# Patient Record
Sex: Female | Born: 1970 | Race: Black or African American | Hispanic: No | Smoking: Never smoker
Health system: Southern US, Community
[De-identification: ages and names within clinical notes are randomized; demographics above are authoritative.]

## PROBLEM LIST (undated history)

## (undated) DIAGNOSIS — E119 Type 2 diabetes mellitus without complications: Secondary | ICD-10-CM

---

## 2017-04-11 ENCOUNTER — Emergency Department (HOSPITAL_COMMUNITY)
Admission: EM | Admit: 2017-04-11 | Discharge: 2017-04-12 | Disposition: A | Discharge status: Home / Self Care | Payer: Managed Care, Other (non HMO) | Attending: Emergency Medicine | Admitting: Emergency Medicine

## 2017-04-11 ENCOUNTER — Encounter (HOSPITAL_COMMUNITY): Payer: Self-pay

## 2017-04-11 ENCOUNTER — Emergency Department (HOSPITAL_COMMUNITY): Payer: Managed Care, Other (non HMO)

## 2017-04-11 DIAGNOSIS — E119 Type 2 diabetes mellitus without complications: Secondary | ICD-10-CM | POA: Insufficient documentation

## 2017-04-11 DIAGNOSIS — R112 Nausea with vomiting, unspecified: Secondary | ICD-10-CM | POA: Insufficient documentation

## 2017-04-11 DIAGNOSIS — R1084 Generalized abdominal pain: Secondary | ICD-10-CM | POA: Diagnosis not present

## 2017-04-11 DIAGNOSIS — R109 Unspecified abdominal pain: Secondary | ICD-10-CM | POA: Diagnosis present

## 2017-04-11 HISTORY — DX: Type 2 diabetes mellitus without complications: E11.9

## 2017-04-11 LAB — CBC
HCT: 40.3 % (ref 36.0–46.0)
Hemoglobin: 12.8 g/dL (ref 12.0–15.0)
MCH: 27.1 pg (ref 26.0–34.0)
MCHC: 31.8 g/dL (ref 30.0–36.0)
MCV: 85.4 fL (ref 78.0–100.0)
PLATELETS: 447 10*3/uL — AB (ref 150–400)
RBC: 4.72 MIL/uL (ref 3.87–5.11)
RDW: 13.7 % (ref 11.5–15.5)
WBC: 5.4 10*3/uL (ref 4.0–10.5)

## 2017-04-11 LAB — BASIC METABOLIC PANEL
ANION GAP: 9 (ref 5–15)
CALCIUM: 9.8 mg/dL (ref 8.9–10.3)
CO2: 28 mmol/L (ref 22–32)
CREATININE: 0.59 mg/dL (ref 0.44–1.00)
Chloride: 102 mmol/L (ref 101–111)
GFR calc Af Amer: >60 mL/min (ref >60)
GLUCOSE: 177 mg/dL — AB (ref 65–99)
Potassium: 3.3 mmol/L — ABNORMAL LOW (ref 3.5–5.1)
Sodium: 139 mmol/L (ref 135–145)

## 2017-04-11 LAB — TROPONIN I

## 2017-04-11 LAB — LIPASE, BLOOD: LIPASE: 37 U/L (ref 11–51)

## 2017-04-11 NOTE — ED Triage Notes (Signed)
Pt endorses left sided chest pain and generalized abd pain x 1 month with n/v every other day. VSS.

## 2017-04-11 NOTE — ED Notes (Signed)
Pt family at nurses station to ask for update, apologized for delay.

## 2017-04-12 LAB — URINALYSIS, ROUTINE W REFLEX MICROSCOPIC
BILIRUBIN URINE: NEGATIVE
GLUCOSE, UA: 50 mg/dL — AB
HGB URINE DIPSTICK: NEGATIVE
KETONES UR: 5 mg/dL — AB
Leukocytes, UA: NEGATIVE
Nitrite: NEGATIVE
PH: 7 (ref 5.0–8.0)
Protein, ur: NEGATIVE mg/dL
SPECIFIC GRAVITY, URINE: 1.013 (ref 1.005–1.030)

## 2017-04-12 LAB — POC OCCULT BLOOD, ED: Fecal Occult Bld: NEGATIVE

## 2017-04-12 LAB — I-STAT CG4 LACTIC ACID, ED: LACTIC ACID, VENOUS: 0.99 mmol/L (ref 0.5–1.9)

## 2017-04-12 MED ORDER — ONDANSETRON HCL 4 MG/2ML IJ SOLN
4.0000 mg | Freq: Once | INTRAMUSCULAR | Status: AC
  Administered 2017-04-12: 4 mg via INTRAVENOUS
  Filled 2017-04-12: qty 2

## 2017-04-12 MED ORDER — PROMETHAZINE HCL 12.5 MG PO TABS: 12.5000 mg | ORAL_TABLET | Freq: Four times a day (QID) | ORAL | 0 refills | Status: AC | PRN

## 2017-04-12 MED ORDER — OXYCODONE-ACETAMINOPHEN 5-325 MG PO TABS
1.0000 | ORAL_TABLET | Freq: Once | ORAL | Status: AC
  Administered 2017-04-12: 1 via ORAL
  Filled 2017-04-12: qty 1

## 2017-04-12 MED ORDER — SODIUM CHLORIDE 0.9 % IV BOLUS (SEPSIS)
500.0000 mL | Freq: Once | INTRAVENOUS | Status: AC
  Administered 2017-04-12: 500 mL via INTRAVENOUS

## 2017-04-12 MED ORDER — MORPHINE SULFATE (PF) 4 MG/ML IV SOLN
4.0000 mg | Freq: Once | INTRAVENOUS | Status: AC
  Administered 2017-04-12: 4 mg via INTRAVENOUS
  Filled 2017-04-12: qty 1

## 2017-04-12 MED ORDER — SODIUM CHLORIDE 0.9 % IV BOLUS (SEPSIS)
1000.0000 mL | Freq: Once | INTRAVENOUS | Status: AC
  Administered 2017-04-12: 1000 mL via INTRAVENOUS

## 2017-04-12 MED ORDER — HYDROCODONE-ACETAMINOPHEN 5-325 MG PO TABS: 1.0000 | ORAL_TABLET | Freq: Four times a day (QID) | ORAL | 0 refills | Status: AC | PRN

## 2017-04-12 NOTE — ED Notes (Signed)
Hemoccult not crossing over. Results are negative. Asher MuirJamie PA aware.

## 2017-04-12 NOTE — ED Notes (Signed)
Pt up to bathroom without difficulty.

## 2017-04-12 NOTE — ED Provider Notes (Signed)
[facility]PT Provider Note   CSN: [id number] Arrival date & time: 04/11/17  2145     History   Chief Complaint Chief Complaint  Patient presents with  . Chest Pain  . Abdominal Pain    HPI Brooke Bray is a 46 y.o. female.  The history is provided by the patient and medical records. No language interpreter was used.  Chest Pain   Associated symptoms include abdominal pain, nausea and vomiting.  Abdominal Pain   Associated symptoms include nausea and vomiting. Pertinent negatives include diarrhea and constipation.   Brooke Bray is a 46 y.o. female  with a PMH of DM who presents to the Emergency Department complaining of intermittent abdominal pain, nausea and vomiting for the last 6 weeks. Pain acutely worsened last night. Pain is described as sharp, stabbing. Lasts about 5 minutes and then resolves. Returns after about 5-10 minutes. Patient states that she was seen at [facility] several times over the last month. 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 Per chart review, IR completed a mesenteric angiogram which did not show any vascular disease. Patient states that she felt better for about 3-4 days after discharge from the hospital, but then n/v and abdominal pain returned. Feels similar to prior when she went to ER at [facility] last month.    Past Medical History:  Diagnosis Date  . Diabetes mellitus without complication (HCC)     There are no active problems to display for this patient.   History reviewed. No pertinent surgical history.  OB History    No data available       Home Medications    Prior to Admission medications   Medication Sig Start Date End Date Taking? Authorizing Provider  HYDROcodone-acetaminophen (NORCO) 5-325 MG tablet Take 1 tablet by mouth every 6 (six) hours as needed for moderate pain. 04/12/17   Ward, Chase Picket, PA-C  promethazine (PHENERGAN) 12.5 MG tablet Take 1 tablet (12.5 mg total) by  mouth every 6 (six) hours as needed for nausea or vomiting. 04/12/17   Ward, Chase Picket, PA-C    Family History History reviewed. No pertinent family history.  Social History Social History  Substance Use Topics  . Smoking status: Never Smoker  . Smokeless tobacco: Not on file  . Alcohol use No     Allergies   Patient has no known allergies.   Review of Systems Review of Systems  Cardiovascular: Positive for chest pain.  Gastrointestinal: Positive for abdominal pain, nausea and vomiting. Negative for blood in stool, constipation and diarrhea.  All other systems reviewed and are negative.    Physical Exam Updated Vital Signs BP 125/82   Pulse 86   Temp 97.9 F (36.6 C) (Oral)   Resp 15   Ht 5\' 4"  (1.626 m)   Wt 65.8 kg (145 lb)   SpO2 100%   BMI 24.89 kg/m   Physical Exam  Constitutional: She is oriented to person, place, and time. She appears well-developed and well-nourished. No distress.  Non-toxic appearing.  HENT:  Head: Normocephalic and atraumatic.  Cardiovascular: Normal rate, regular rhythm and normal heart sounds.   No murmur heard. Pulmonary/Chest: Effort normal and breath sounds normal. No respiratory distress.  Abdominal: Soft. Bowel sounds are normal. She exhibits no distension.  Diffuse generalized tenderness without rebound or guarding. No CVA tenderness.  Musculoskeletal: She exhibits no edema.  Neurological: She is alert and oriented to person, place, and time.  Skin: Skin is  warm and dry.  Nursing note and vitals reviewed.    ED Treatments / Results  Labs (all labs ordered are listed, but only abnormal results are displayed) Labs Reviewed  BASIC METABOLIC PANEL - Abnormal; Notable for the following:       Result Value   Potassium 3.3 (*)    Glucose, Bld 177 (*)    BUN <5 (*)    All other components within normal limits  CBC - Abnormal; Notable for the following:    Platelets 447 (*)    All other components within normal limits    URINALYSIS, ROUTINE W REFLEX MICROSCOPIC - Abnormal; Notable for the following:    Glucose, UA 50 (*)    Ketones, ur 5 (*)    All other components within normal limits  TROPONIN I  LIPASE, BLOOD  I-STAT CG4 LACTIC ACID, ED  POC OCCULT BLOOD, ED    EKG  EKG Interpretation  Date/Time:  Monday April 11 2017 21:53:29 EDT Ventricular Rate:  108 PR Interval:  136 QRS Duration: 60 QT Interval:  344 QTC Calculation: 460 R Axis:   22 Text Interpretation:  Sinus tachycardia Cannot rule out Anterior infarct , age undetermined Abnormal ECG No acute changes No significant change since last tracing Confirmed by Derwood Kaplan [phone]) on 04/12/2017 1:42:21 AM       Radiology Dg Chest 2 View  Result Date: 04/11/2017 CLINICAL DATA:  Acute onset of left upper chest pain. Initial encounter. EXAM: CHEST  2 VIEW COMPARISON:  None. FINDINGS: The lungs are well-aerated and clear. There is no evidence of focal opacification, pleural effusion or pneumothorax. The heart is normal in size; the mediastinal contour is within normal limits. No acute osseous abnormalities are seen. IMPRESSION: No acute cardiopulmonary process seen. Electronically Signed   By: Roanna Raider M.D.   On: 04/11/2017 22:22    Procedures Procedures (including critical care time)  Medications Ordered in ED Medications  sodium chloride 0.9 % bolus 1,000 mL (0 mLs Intravenous Stopped 04/12/17 0321)  ondansetron (ZOFRAN) injection 4 mg (4 mg Intravenous Given 04/12/17 0143)  morphine 4 MG/ML injection 4 mg (4 mg Intravenous Given 04/12/17 0153)  sodium chloride 0.9 % bolus 500 mL (0 mLs Intravenous Stopped 04/12/17 0524)  oxyCODONE-acetaminophen (PERCOCET/ROXICET) 5-325 MG per tablet 1 tablet (1 tablet Oral Given 04/12/17 0427)     Initial Impression / Assessment and Plan / ED Course  I have reviewed the triage vital signs and the nursing notes.  Pertinent labs & imaging results that were available during my care of the patient  were reviewed by me and considered in my medical decision making (see chart for details).    Brooke Bray is a 46 y.o. female who presents to ED for intermittent n/v and abdominal pain for the last 6 weeks. She has been seen in ED at [facility] on 5/04, 5/20 and 5/21. She was then seen at [facility] on 5/22 where she was admitted. Chart extensively reviewed from these encounters.   Per chart review from discharge summary during this admission: "Her workup prior to admission included negative RUQ [place] and negative CT A/P w contrast. Given severity of symptoms, CT A/P w contrast was repeated on admission on 5/23 and again did not reveal intra-abdominal pathology. GI was consulted and performed EGD on 5/24 that was largely unremarkable. Pt also reported constipation for several days prior to admission and multiple enemas were performed. After receiving SMOG enema, she developed some BRBPR. Hb remained stable but she developed  a leukocytosis. She was cultured and started on IV cipro/flagyl. MRCP 5/26 unexpectedly showed possible colitis, most c/w infectious or inflammatory cause. Lactate remained wnl. C diff was neg. Blood and stool ultures showed now growth. It was felt that colitis was not present on admission, given normal WBC, afebrile and negative CT x2 in the setting of symptoms for weeks, and that this colitis may have been a sequelae of enemas or represent a hypotensive episode in setting of her poor po intake and keosis. GI closely followed along and performed colonoscopy on 5/29 the revealed watershed pattern of ischemic colitis. IR completed mesenteric angiogram that did not show any vascular disease, biopsies confirmed ischemia. General surgery was consulted and agreed with team's management. She was supported with IVF, IV anti-emetics, IV dilaudid. Her abdominal pain had improved enough that by the day of discharge she did not require any narcotics."  Labs today reviewed and reassuring. Hemoccult did not cross  over in the computer, but was negative. Repeat abdominal exam improved.  Feels better after pain and nausea meds along with IV hydration. She is now tolerating PO and feels improved. Evaluation does not show pathology that would require ongoing emergent intervention or inpatient treatment. She [facility]. She would like to see if one of our GI docs could see her earlier. Referral for GI given. Spoke at length with patient and family at the bedside about reasons to return to ER. All questions answered.   Patient discussed with Dr. Rhunette CroftNanavati who agrees with treatment plan.    Final Clinical Impressions(s) / ED Diagnoses   Final diagnoses:  Generalized abdominal pain  Non-intractable vomiting with nausea, unspecified vomiting type    New Prescriptions Discharge Medication List as of 04/12/2017  5:20 AM    START taking these medications   Details  HYDROcodone-acetaminophen (NORCO) 5-325 MG tablet Take 1 tablet by mouth every 6 (six) hours as needed for moderate pain., Starting Tue 04/12/2017, Print          Ward, Chase PicketJaime Pilcher, PA-C 04/12/17 [id number]    Derwood KaplanNanavati, Ankit, MD 04/12/17 2318

## 2017-04-12 NOTE — Discharge Instructions (Signed)
It was my pleasure taking care of you today! I hope you feel better soon.   Please call the GI clinic listed today to schedule a follow up appointment.   Increase hydration.   Return to ER for new or worsening symptoms, any additional concerns.   Pain medication only as needed for severe pain. Do not drink alcohol, drive or participate in any other potentially dangerous activities while taking opiate pain medication as it may make you sleepy. Do not take this medication with any other sedating medications, either prescription or over-the-counter. Do not take this with acetaminophen (Tylenol) as it is already contained within these medications.   This medication is an opiate (or narcotic) pain medication and can be habit forming.  Use it as little as possible to achieve adequate pain control.  Do not use or use it with extreme caution if you have a history of opiate abuse or dependence. This medication is intended for your use only - do not give any to anyone else and keep it in a secure place where nobody else, especially children, have access to it. It will also cause or worsen constipation, so you may want to consider taking an over-the-counter stool softener while you are taking this medication.

## 2017-04-22 ENCOUNTER — Encounter (HOSPITAL_COMMUNITY): Payer: Self-pay

## 2017-04-22 ENCOUNTER — Emergency Department (HOSPITAL_COMMUNITY): Payer: Managed Care, Other (non HMO)

## 2017-04-22 ENCOUNTER — Emergency Department (HOSPITAL_COMMUNITY)
Admission: EM | Admit: 2017-04-22 | Discharge: 2017-04-22 | Disposition: A | Discharge status: Home / Self Care | Payer: Managed Care, Other (non HMO) | Attending: Emergency Medicine | Admitting: Emergency Medicine

## 2017-04-22 DIAGNOSIS — R1084 Generalized abdominal pain: Secondary | ICD-10-CM | POA: Diagnosis present

## 2017-04-22 DIAGNOSIS — K5900 Constipation, unspecified: Secondary | ICD-10-CM

## 2017-04-22 DIAGNOSIS — Z794 Long term (current) use of insulin: Secondary | ICD-10-CM | POA: Diagnosis not present

## 2017-04-22 DIAGNOSIS — Z79899 Other long term (current) drug therapy: Secondary | ICD-10-CM | POA: Insufficient documentation

## 2017-04-22 DIAGNOSIS — E119 Type 2 diabetes mellitus without complications: Secondary | ICD-10-CM | POA: Insufficient documentation

## 2017-04-22 LAB — COMPREHENSIVE METABOLIC PANEL
ALT: 14 U/L (ref 14–54)
AST: 18 U/L (ref 15–41)
Albumin: 3.9 g/dL (ref 3.5–5.0)
Alkaline Phosphatase: 46 U/L (ref 38–126)
Anion gap: 6 (ref 5–15)
BUN: 6 mg/dL (ref 6–20)
CALCIUM: 9.8 mg/dL (ref 8.9–10.3)
CO2: 28 mmol/L (ref 22–32)
CREATININE: 0.59 mg/dL (ref 0.44–1.00)
Chloride: 103 mmol/L (ref 101–111)
GFR calc Af Amer: >60 mL/min (ref >60)
GFR calc non Af Amer: >60 mL/min (ref >60)
GLUCOSE: 171 mg/dL — AB (ref 65–99)
POTASSIUM: 3.5 mmol/L (ref 3.5–5.1)
Sodium: 137 mmol/L (ref 135–145)
TOTAL PROTEIN: 6.6 g/dL (ref 6.5–8.1)
Total Bilirubin: 0.7 mg/dL (ref 0.3–1.2)

## 2017-04-22 LAB — LIPASE, BLOOD: LIPASE: 36 U/L (ref 11–51)

## 2017-04-22 LAB — CBC WITH DIFFERENTIAL/PLATELET
BASOS ABS: 0 10*3/uL (ref 0.0–0.1)
Basophils Relative: 0 %
Eosinophils Absolute: 0 10*3/uL (ref 0.0–0.7)
Eosinophils Relative: 1 %
HEMATOCRIT: 43.4 % (ref 36.0–46.0)
HEMOGLOBIN: 14 g/dL (ref 12.0–15.0)
Lymphocytes Relative: 39 %
Lymphs Abs: 2.2 10*3/uL (ref 0.7–4.0)
MCH: 27.7 pg (ref 26.0–34.0)
MCHC: 32.3 g/dL (ref 30.0–36.0)
MCV: 85.8 fL (ref 78.0–100.0)
MONO ABS: 0.3 10*3/uL (ref 0.1–1.0)
Monocytes Relative: 5 %
NEUTROS ABS: 3.1 10*3/uL (ref 1.7–7.7)
NEUTROS PCT: 55 %
Platelets: 356 10*3/uL (ref 150–400)
RBC: 5.06 MIL/uL (ref 3.87–5.11)
RDW: 13.6 % (ref 11.5–15.5)
WBC: 5.6 10*3/uL (ref 4.0–10.5)

## 2017-04-22 LAB — I-STAT BETA HCG BLOOD, ED (MC, WL, AP ONLY): I-stat hCG, quantitative: <5 m[IU]/mL (ref <5)

## 2017-04-22 LAB — I-STAT CG4 LACTIC ACID, ED: Lactic Acid, Venous: 1.14 mmol/L (ref 0.5–1.9)

## 2017-04-22 MED ORDER — SODIUM CHLORIDE 0.9 % IV BOLUS (SEPSIS)
1000.0000 mL | Freq: Once | INTRAVENOUS | Status: AC
  Administered 2017-04-22: 1000 mL via INTRAVENOUS

## 2017-04-22 MED ORDER — IOPAMIDOL (ISOVUE-300) INJECTION 61%
100.0000 mL | Freq: Once | INTRAVENOUS | Status: AC | PRN
  Administered 2017-04-22: 100 mL via INTRAVENOUS

## 2017-04-22 MED ORDER — DICYCLOMINE HCL 20 MG PO TABS: 20.0000 mg | ORAL_TABLET | Freq: Four times a day (QID) | ORAL | 0 refills | Status: AC | PRN

## 2017-04-22 MED ORDER — DICYCLOMINE HCL 10 MG PO CAPS
10.0000 mg | ORAL_CAPSULE | Freq: Once | ORAL | Status: AC
  Administered 2017-04-22: 10 mg via ORAL
  Filled 2017-04-22: qty 1

## 2017-04-22 MED ORDER — IOPAMIDOL (ISOVUE-300) INJECTION 61%
INTRAVENOUS | Status: AC
  Filled 2017-04-22: qty 100

## 2017-04-22 NOTE — ED Notes (Signed)
Pt and family understood dc material. NAD noted. Scripts given at dc 

## 2017-04-22 NOTE — ED Provider Notes (Signed)
[facility]PT Provider Note   CSN: [id number] Arrival date & time: 04/22/17  1215     History   Chief Complaint Chief Complaint  Patient presents with  . Abdominal Pain    HPI Brooke Bray is a 46 y.o. female.  The history is provided by the patient. No language interpreter was used.  Abdominal Pain     Brooke Bray is a 46 y.o. female who presents to the Emergency Department complaining of abdominal pain.  [facility] the end of May [facility] and was diagnosed with ischemic colitis. Her symptoms had transiently improved only to return 1 week ago. She reports 1 week of increasing generalized, constant abdominal pain. Her last bowel movement was 11 days ago. Yesterday she used an enema at home with a small amount of soft stool return. She reports of vomiting every other day at home. No fevers. She has decreased urinary output but no dysuria. She was on pain medications which would give her temporary relief but she has not taken these for the last week. No prior abdominal surgeries. She was taking MiraLAX 3 times a day but had no significant improvement in her symptoms  She is followed by Dr. Randa [facility]ensEdwards with [facility]. Past Medical History:  Diagnosis Date  . Diabetes mellitus without complication (HCC)     There are no active problems to display for this patient.   History reviewed. No pertinent surgical history.  OB History    No data available       Home Medications    Prior to Admission medications   Medication Sig Start Date End Date Taking? Authorizing Provider  acetaminophen (TYLENOL) 325 MG tablet Take 325 mg by mouth every 6 (six) hours as needed (for pain).   Yes [provider]  HUMALOG KWIKPEN 100 UNIT/ML KiwkPen Inject 2-4 Units into the skin 3 (three) times daily before meals. 4 units before breakfast then 2 units before lunch then 4 units before supper 04/14/17  Yes [provider]  Insulin Glargine  (LANTUS SOLOSTAR) 100 UNIT/ML Solostar Pen Inject 20 Units into the skin at bedtime.   Yes [provider]  ondansetron (ZOFRAN) 4 MG tablet Take 4 mg by mouth See admin instructions. Every 4 to 6 hours as needed for nausea 04/20/17  Yes [provider]  dicyclomine (BENTYL) 20 MG tablet Take 1 tablet (20 mg total) by mouth 4 (four) times daily as needed for spasms. 04/22/17   Tilden Fossaees, Sherley Mckenney, MD  HYDROcodone-acetaminophen [facility](NORCO) 5-325 MG tablet Take 1 tablet by mouth every 6 (six) hours as needed for moderate pain. Patient not taking: Reported on 04/22/2017 04/12/17   Ward, Chase PicketJaime Pilcher, PA-C  promethazine (PHENERGAN) 12.5 MG tablet Take 1 tablet (12.5 mg total) by mouth every 6 (six) hours as needed for nausea or vomiting. Patient not taking: Reported on 04/22/2017 04/12/17   Ward, Chase PicketJaime Pilcher, PA-C    Family History History reviewed. No pertinent family history.  Social History Social History  Substance Use Topics  . Smoking status: Never Smoker  . Smokeless tobacco: Never Used  . Alcohol use No     Allergies   Patient has no known allergies.   Review of Systems Review of Systems  Gastrointestinal: Positive for abdominal pain.  All other systems reviewed and are negative.    Physical Exam Updated Vital Signs BP (!) 145/89   Pulse 87   Temp 98 F (36.7 C) (Oral)   Resp 18  SpO2 100%   Physical Exam  Constitutional: She is oriented to person, place, and time. She appears well-developed and well-nourished.  HENT:  Head: Normocephalic and atraumatic.  Cardiovascular: Normal rate and regular rhythm.   No murmur heard. Pulmonary/Chest: Effort normal and breath sounds normal. No respiratory distress.  Abdominal: Soft. There is no rebound and no guarding.  Mild to moderate generalized abdominal tenderness  Genitourinary:  Genitourinary Comments: Nontender rectal exam. Empty rectal vault. No gross blood.  Musculoskeletal: She exhibits no edema or  tenderness.  Neurological: She is alert and oriented to person, place, and time.  Skin: Skin is warm and dry.  Psychiatric: She has a normal mood and affect. Her behavior is normal.  Nursing note and vitals reviewed.    ED Treatments / Results  Labs (all labs ordered are listed, but only abnormal results are displayed) Labs Reviewed  COMPREHENSIVE METABOLIC PANEL - Abnormal; Notable for the following:       Result Value   Glucose, Bld 171 (*)    All other components within normal limits  CBC WITH DIFFERENTIAL/PLATELET  LIPASE, BLOOD  I-STAT CG4 LACTIC ACID, ED  I-STAT BETA HCG BLOOD, ED ([facility], [facility], [facility] ONLY)    EKG  EKG Interpretation None       Radiology Ct Abdomen Pelvis W Contrast  Result Date: 04/22/2017 CLINICAL DATA:  Constipation. Abdominal distention with nausea and vomiting for 7 days. EXAM: CT ABDOMEN AND PELVIS WITH CONTRAST TECHNIQUE: Multidetector CT imaging of the abdomen and pelvis was performed using the standard protocol following bolus administration of intravenous contrast. CONTRAST:  ISOVUE-300 IOPAMIDOL (ISOVUE-300) INJECTION 61% COMPARISON:  None. FINDINGS: Lower chest: No acute abnormality. Hepatobiliary: No focal liver abnormality is seen. No gallstones, gallbladder wall thickening, or biliary dilatation. Pancreas: Unremarkable. No pancreatic ductal dilatation or surrounding inflammatory changes. Spleen: Normal in size without focal abnormality. Adrenals/Urinary Tract: Adrenal glands are unremarkable. Kidneys are normal, without renal calculi, focal lesion, or hydronephrosis. Bladder is unremarkable.Adrenal glands are normal. Stomach/Bowel: The stomach and small bowel are normal. A few scattered colonic diverticuli are seen without diverticulitis. There is fecal loading in the right colon. The colon is otherwise normal. The appendix is not well seen but there is no secondary evidence of appendicitis. Vascular/Lymphatic: No significant vascular findings are  present. No enlarged abdominal or pelvic lymph nodes. Reproductive: An IUD is identified. A small follicle is seen in the left ovary. The right ovary normal. Other: No free air or free fluid. A small fat containing umbilical hernia is identified. Musculoskeletal: No acute or significant osseous findings. IMPRESSION: 1. Fecal loading is seen in the right colon. No other acute abnormalities. Electronically Signed   By: Gerome Sam III M.D   On: 04/22/2017 18:42   Dg Abd Acute W/chest  Result Date: 04/22/2017 CLINICAL DATA:  Abdominal pain.  Vomiting.  Left-sided chest pain. EXAM: DG ABDOMEN ACUTE W/ 1V CHEST COMPARISON:  Chest x-ray dated 04/11/2017 FINDINGS: There is no evidence of dilated bowel loops or free intraperitoneal air. No radiopaque calculi or other significant radiographic abnormality is seen. IUD in place. Heart size and mediastinal contours are within normal limits. Both lungs are clear. IMPRESSION: Negative abdominal radiographs.  No acute cardiopulmonary disease. Electronically Signed   By: Francene Boyers M.D.   On: 04/22/2017 17:10    Procedures Procedures (including critical care time)  Medications Ordered in ED Medications  sodium chloride 0.9 % bolus 1,000 mL (0 mLs Intravenous Stopped 04/22/17 1811)  dicyclomine (BENTYL) capsule 10  mg (10 mg Oral Given 04/22/17 1847)  iopamidol (ISOVUE-300) 61 % injection 100 mL (100 mLs Intravenous Contrast Given 04/22/17 1815)     Initial Impression / Assessment and Plan / ED Course  I have reviewed the triage vital signs and the nursing notes.  Pertinent labs & imaging results that were available during my care of the patient were reviewed by me and considered in my medical decision making (see chart for details).  Clinical Course as of Apr 22 2333  Fri Apr 22, 2017  1759 D/w Gastroenterologist on call  [ER]    Clinical Course User Index [ER] Tilden Fossa, MD    Patient here for evaluation of abdominal pain, constipation.  Rectal exam with no stool in rectal vault. No evidence of ischemic colitis, acute infectious process. CT abdomen obtained after discussion with GI. CT demonstrates stool in the colon. Discussed home care for constipation. Will treat with magnesium citrate, MiraLAX, Senokot.  Home care, outpatient follow up, return precautions.   Final Clinical Impressions(s) / ED Diagnoses   Final diagnoses:  Generalized abdominal pain  Constipation, unspecified constipation type    New Prescriptions Discharge Medication List as of 04/22/2017  7:36 PM    START taking these medications   Details  dicyclomine (BENTYL) 20 MG tablet Take 1 tablet (20 mg total) by mouth 4 (four) times daily as needed for spasms., Starting Fri 04/22/2017, Print         Tilden Fossa, MD 04/22/17 [phone]

## 2017-04-22 NOTE — ED Triage Notes (Signed)
Pt presents with 11 day h/o constipation.  Pt has been using miralax and enema without relief.  Pt reports she Is passing gas; reports abdominal distension and generalized abdominal pain.  Reports intermittent nausea and vomiting.  Denies any abdominal surgery.

## 2017-04-22 NOTE — Discharge Instructions (Signed)
Take a bottle of magnesium citrate, available over the counter, tonight.  Continue your miralax as directed by your Gastroenterologist.  You can take Senokot, available over-the-counter according to label directions.

## 2017-04-22 NOTE — ED Notes (Signed)
Pt given soap suds enema. Pt retaining fluid and will attempt to use the bedside commode after a few minutes.

## 2017-04-22 NOTE — ED Notes (Signed)
Dr. Reese at bedside.

## 2017-04-22 NOTE — ED Notes (Signed)
Pt to CT

## 2017-05-03 ENCOUNTER — Other Ambulatory Visit: Payer: Self-pay | Admitting: Gastroenterology

## 2017-05-03 DIAGNOSIS — R112 Nausea with vomiting, unspecified: Secondary | ICD-10-CM

## 2017-05-09 ENCOUNTER — Ambulatory Visit
Admission: RE | Admit: 2017-05-09 | Discharge: 2017-05-09 | Disposition: A | Discharge status: Home / Self Care | Payer: Managed Care, Other (non HMO) | Source: Ambulatory Visit | Attending: Gastroenterology | Admitting: Gastroenterology

## 2017-05-09 DIAGNOSIS — R112 Nausea with vomiting, unspecified: Secondary | ICD-10-CM

## 2017-05-09 MED ORDER — IOPAMIDOL (ISOVUE-300) INJECTION 61%
100.0000 mL | Freq: Once | INTRAVENOUS | Status: AC | PRN
Start: 2017-05-09 — End: 2017-05-09
  Administered 2017-05-09: 100 mL via INTRAVENOUS

## 2017-08-24 ENCOUNTER — Emergency Department (HOSPITAL_COMMUNITY): Payer: 59

## 2017-08-24 ENCOUNTER — Emergency Department (HOSPITAL_COMMUNITY)
Admission: EM | Admit: 2017-08-24 | Discharge: 2017-08-25 | Disposition: A | Discharge status: Home / Self Care | Payer: 59 | Attending: Emergency Medicine | Admitting: Emergency Medicine

## 2017-08-24 ENCOUNTER — Other Ambulatory Visit: Payer: Self-pay

## 2017-08-24 ENCOUNTER — Encounter (HOSPITAL_COMMUNITY): Payer: Self-pay

## 2017-08-24 DIAGNOSIS — E119 Type 2 diabetes mellitus without complications: Secondary | ICD-10-CM | POA: Diagnosis not present

## 2017-08-24 DIAGNOSIS — G43A0 Cyclical vomiting, not intractable: Secondary | ICD-10-CM | POA: Insufficient documentation

## 2017-08-24 DIAGNOSIS — R1115 Cyclical vomiting syndrome unrelated to migraine: Secondary | ICD-10-CM

## 2017-08-24 DIAGNOSIS — Z794 Long term (current) use of insulin: Secondary | ICD-10-CM | POA: Diagnosis not present

## 2017-08-24 DIAGNOSIS — Z79899 Other long term (current) drug therapy: Secondary | ICD-10-CM | POA: Insufficient documentation

## 2017-08-24 DIAGNOSIS — R1013 Epigastric pain: Secondary | ICD-10-CM | POA: Diagnosis present

## 2017-08-24 LAB — BASIC METABOLIC PANEL
Anion gap: 8 (ref 5–15)
BUN: <5 mg/dL — ABNORMAL LOW (ref 6–20)
CALCIUM: 9.5 mg/dL (ref 8.9–10.3)
CHLORIDE: 104 mmol/L (ref 101–111)
CO2: 24 mmol/L (ref 22–32)
CREATININE: 0.59 mg/dL (ref 0.44–1.00)
Glucose, Bld: 127 mg/dL — ABNORMAL HIGH (ref 65–99)
Potassium: 3.7 mmol/L (ref 3.5–5.1)
SODIUM: 136 mmol/L (ref 135–145)

## 2017-08-24 LAB — I-STAT TROPONIN, ED: TROPONIN I, POC: 0 ng/mL (ref 0.00–0.08)

## 2017-08-24 LAB — LIPASE, BLOOD: LIPASE: 26 U/L (ref 11–51)

## 2017-08-24 LAB — CBC
HCT: 42.5 % (ref 36.0–46.0)
Hemoglobin: 14.7 g/dL (ref 12.0–15.0)
MCH: 27.9 pg (ref 26.0–34.0)
MCHC: 34.6 g/dL (ref 30.0–36.0)
MCV: 80.8 fL (ref 78.0–100.0)
PLATELETS: 369 10*3/uL (ref 150–400)
RBC: 5.26 MIL/uL — AB (ref 3.87–5.11)
RDW: 12.6 % (ref 11.5–15.5)
WBC: 6.2 10*3/uL (ref 4.0–10.5)

## 2017-08-24 LAB — HEPATIC FUNCTION PANEL
ALT: 10 U/L — ABNORMAL LOW (ref 14–54)
AST: 17 U/L (ref 15–41)
Albumin: 3.9 g/dL (ref 3.5–5.0)
Alkaline Phosphatase: 53 U/L (ref 38–126)
TOTAL PROTEIN: 7.1 g/dL (ref 6.5–8.1)
Total Bilirubin: 0.9 mg/dL (ref 0.3–1.2)

## 2017-08-24 MED ORDER — MORPHINE SULFATE (PF) 4 MG/ML IV SOLN
8.0000 mg | Freq: Once | INTRAVENOUS | Status: AC
  Administered 2017-08-24: 8 mg via INTRAVENOUS
  Filled 2017-08-24: qty 2

## 2017-08-24 MED ORDER — GI COCKTAIL ~~LOC~~
30.0000 mL | Freq: Once | ORAL | Status: AC
  Administered 2017-08-25: 30 mL via ORAL
  Filled 2017-08-24: qty 30

## 2017-08-24 MED ORDER — PANTOPRAZOLE SODIUM 40 MG IV SOLR
40.0000 mg | Freq: Once | INTRAVENOUS | Status: AC
  Administered 2017-08-24: 40 mg via INTRAVENOUS
  Filled 2017-08-24: qty 40

## 2017-08-24 MED ORDER — HALOPERIDOL LACTATE 5 MG/ML IJ SOLN
5.0000 mg | Freq: Once | INTRAMUSCULAR | Status: AC
  Administered 2017-08-24: 5 mg via INTRAVENOUS
  Filled 2017-08-24: qty 1

## 2017-08-24 MED ORDER — LACTATED RINGERS IV BOLUS (SEPSIS)
1000.0000 mL | Freq: Once | INTRAVENOUS | Status: AC
  Administered 2017-08-24: 1000 mL via INTRAVENOUS

## 2017-08-24 MED ORDER — METOCLOPRAMIDE HCL 5 MG/ML IJ SOLN
10.0000 mg | Freq: Once | INTRAMUSCULAR | Status: AC
  Administered 2017-08-24: 10 mg via INTRAVENOUS
  Filled 2017-08-24: qty 2

## 2017-08-24 NOTE — ED Notes (Signed)
Patient transported to X-ray 

## 2017-08-24 NOTE — ED Triage Notes (Signed)
Patient complains of 3 days of abdominal discomfort with vomiting. States that she also has CP since earlier today. On assessment alert and oriented, HR 140. Left [facility] AMA earlier today because she never saw GI doctor. Alert and oriented.

## 2017-08-25 MED ORDER — ONDANSETRON 4 MG PO TBDP
4.0000 mg | ORAL_TABLET | Freq: Once | ORAL | Status: AC
  Administered 2017-08-25: 4 mg via ORAL
  Filled 2017-08-25: qty 1

## 2017-08-25 MED ORDER — PANTOPRAZOLE SODIUM 20 MG PO TBEC: 20.0000 mg | DELAYED_RELEASE_TABLET | Freq: Every day | ORAL | 1 refills | Status: AC

## 2017-08-25 MED ORDER — METOCLOPRAMIDE HCL 10 MG PO TABS: 10.0000 mg | ORAL_TABLET | Freq: Four times a day (QID) | ORAL | 0 refills | Status: AC

## 2017-08-25 NOTE — Discharge Instructions (Signed)
All the results in the ER are normal, labs and imaging. We are not sure what is causing your symptoms. The workup in the ER is not complete, and is limited to screening for life threatening and emergent conditions only, so please see your primary care doctor or GI doctor for further evaluation.  Please return to the ER if your symptoms worsen; you have increased pain, fevers, chills, inability to keep any medications down, confusion. Otherwise see the outpatient doctor as requested.

## 2017-08-25 NOTE — ED Notes (Signed)
Pt vomited x 1. Dr. Rhunette CroftNanavati aware.

## 2017-08-28 NOTE — ED Provider Notes (Signed)
[facility] EMERGENCY DEPARTMENT Provider Note   CSN: [id number] Arrival date & time: 08/24/17  1846     History   Chief Complaint Chief Complaint  Patient presents with  . chest pain/vomiting    HPI Brooke Bray is a 46 y.o. female.  HPI Patient comes in with chief complaint of abd pain, nausea. Patient reports that she has been having abdominal pain for the past 3 days.  Abdominal pain is epigastric and severe.  Patient has associated nausea and vomiting.  Patient was admitted to [facility] and left AMA, since it appeared to her that the admitting team had no plans on of having GI to see her.  Patient's pain is constant, and there is no specific aggravating or relieving factors.  Patient reports 10+ episodes of emesis daily.  No blood, no bile.  Patient denies any diarrhea, in fact she is constipated.  Last BM was earlier today.  Patient has no history of abdominal surgery.  Patient has had flareup of similar nature in the past year.  Patient was admitted to [facility] earlier in the year, and she had upper endoscopy which revealed H. pylori gastritis.  She also had a colonoscopy which showed possibly ischemic colitis.  It was deemed that patient ischemic colitis was likely from hypovolemia.  Patient was told that she does not have any Crohn's or ulcerative colitis.  CT angiogram following the ischemic colitis finding was negative.  Patient is noted to be tachycardic, however she also had a CT PE recently which was negative for blood clots.  Social history is negative for any substance abuse, heavy smoking or alcohol abuse.  Past Medical History:  Diagnosis Date  . Diabetes mellitus without complication (HCC)     There are no active problems to display for this patient.   History reviewed. No pertinent surgical history.  OB History    No data available       Home Medications    Prior to Admission medications   Medication Sig Start Date End Date  Taking? Authorizing Provider  HUMALOG KWIKPEN 100 UNIT/ML KiwkPen Inject 2-4 Units into the skin 3 (three) times daily before meals. 4 units before breakfast then 2 units before lunch then 4 units before supper 04/14/17  Yes [provider]  Insulin Glargine (LANTUS SOLOSTAR) 100 UNIT/ML Solostar Pen Inject 20 Units into the skin at bedtime.   Yes [provider]  polyethylene glycol powder (GLYCOLAX/MIRALAX) powder Take 17 g by mouth 2 (two) times daily. 03/28/17  Yes [provider]  dicyclomine (BENTYL) 20 MG tablet Take 1 tablet (20 mg total) by mouth 4 (four) times daily as needed for spasms. Patient not taking: Reported on 08/24/2017 04/22/17   Tilden Fossa, MD  HYDROcodone-acetaminophen [facility]) 5-325 MG tablet Take 1 tablet by mouth every 6 (six) hours as needed for moderate pain. Patient not taking: Reported on 04/22/2017 04/12/17   Ward, Chase Picket, PA-C  metoCLOPramide (REGLAN) 10 MG tablet Take 1 tablet (10 mg total) by mouth every 6 (six) hours. 08/25/17   Derwood Kaplan, MD  pantoprazole (PROTONIX) 20 MG tablet Take 1 tablet (20 mg total) by mouth daily. 08/25/17   Derwood Kaplan, MD  promethazine (PHENERGAN) 12.5 MG tablet Take 1 tablet (12.5 mg total) by mouth every 6 (six) hours as needed for nausea or vomiting. Patient not taking: Reported on 04/22/2017 04/12/17   Ward, Chase Picket, PA-C    Family History No family history on file.  Social History Social  History  Substance Use Topics  . Smoking status: Never Smoker  . Smokeless tobacco: Never Used  . Alcohol use No     Allergies   Patient has no known allergies.   Review of Systems Review of Systems  All other systems reviewed and are negative.    Physical Exam Updated Vital Signs BP 130/85   Pulse (!) 107   Temp 98.6 F (37 C) (Oral)   Resp 14   SpO2 100%   Physical Exam  Constitutional: She is oriented to person, place, and time. She appears well-developed and well-nourished.   HENT:  Head: Normocephalic and atraumatic.  Eyes: Pupils are equal, round, and reactive to light. EOM are normal.  Neck: Neck supple.  Cardiovascular: Normal rate, regular rhythm and normal heart sounds.   No murmur heard. Pulmonary/Chest: Effort normal. No respiratory distress.  Abdominal: Soft. She exhibits no distension. There is tenderness. There is guarding. There is no rebound.  Neurological: She is alert and oriented to person, place, and time.  Skin: Skin is warm and dry.  Nursing note and vitals reviewed.    ED Treatments / Results  Labs (all labs ordered are listed, but only abnormal results are displayed) Labs Reviewed  BASIC METABOLIC PANEL - Abnormal; Notable for the following:       Result Value   Glucose, Bld 127 (*)    BUN <5 (*)    All other components within normal limits  CBC - Abnormal; Notable for the following:    RBC 5.26 (*)    All other components within normal limits  HEPATIC FUNCTION PANEL - Abnormal; Notable for the following:    ALT 10 (*)    Bilirubin, Direct <0.1 (*)    All other components within normal limits  LIPASE, BLOOD  I-STAT TROPONIN, ED    EKG  EKG Interpretation  Date/Time:  Wednesday August 24 2017 18:57:22 EDT Ventricular Rate:  132 PR Interval:  122 QRS Duration: 66 QT Interval:  300 QTC Calculation: 444 R Axis:   7 Text Interpretation:  Sinus tachycardia Biatrial enlargement Abnormal ECG No acute changes Confirmed by Derwood KaplanNanavati, Phat Dalton [phone]) on 08/24/2017 9:51:00 PM Also confirmed by Derwood KaplanNanavati, Rosellen Lichtenberger [phone]), editor Madalyn RobEverhart, Marilyn [phone])  on 08/25/2017 8:01:19 AM       Radiology No results found.  Procedures Procedures (including critical care time)  Medications Ordered in ED Medications  lactated ringers bolus 1,000 mL (0 mLs Intravenous Stopped 08/25/17 0058)  haloperidol lactate (HALDOL) injection 5 mg (5 mg Intravenous Given 08/24/17 2243)  metoCLOPramide (REGLAN) injection 10 mg (10 mg Intravenous Given  08/24/17 2243)  gi cocktail (Maalox,Lidocaine,Donnatal) (30 mLs Oral Given 08/25/17 0005)  morphine 4 MG/ML injection 8 mg (8 mg Intravenous Given 08/24/17 2243)  pantoprazole (PROTONIX) injection 40 mg (40 mg Intravenous Given 08/24/17 2243)  ondansetron (ZOFRAN-ODT) disintegrating tablet 4 mg (4 mg Oral Given 08/25/17 0047)     Initial Impression / Assessment and Plan / ED Course  I have reviewed the triage vital signs and the nursing notes.  Pertinent labs & imaging results that were available during my care of the patient were reviewed by me and considered in my medical decision making (see chart for details).  Clinical Course as of Aug 29 3  Thu Aug 25, 2017  0016 Pt reassessed. Pt's VSS and WNL. Pt's cap refill < 3 seconds. Pt has been hydrated in the ER and now passed po challenge. We will discharge with antiemetic. Strict ER return precautions have been  discussed and pt will return if he is unable to tolerate fluids and symptoms are getting worse.   [AN]    Clinical Course User Index [AN] Derwood Kaplan, MD    Pt comes in with cc of abd pain, nausea and emesis. Patient appears to have abdominal pain that has not yet been diagnosed. Patient is having abdominal tenderness with nausea at the moment.  She is noted to be tachycardic.  Recent PE workup was negative.  Colonoscopy, upper endoscopy, CT angios abdomen from [facility] has been reviewed.  Abdominal exam is not peritoneal.  Labs in the ER is not showing any acute changes.  P.o. challenge initiated, and patient passed.Marland Kitchen Results from the ER workup discussed with the patient face to face and all questions answered to the best of my ability.  I informed patient that unfortunately we do not have admissible diagnosis.  Her vital signs have improved over time, labs are reassuring, and patient has emesis and control.  We will discharge patient with antiemetics.  Patient was second opinion from GI, and we have given her Dr. Randa Evens f/u  info.  Strict ER return precautions have been discussed, and patient is agreeing with the plan and is comfortable with the workup done and the recommendations from the ER.    Final Clinical Impressions(s) / ED Diagnoses   Final diagnoses:  Non-intractable cyclical vomiting with nausea    New Prescriptions Discharge Medication List as of 08/25/2017 12:21 AM    START taking these medications   Details  metoCLOPramide (REGLAN) 10 MG tablet Take 1 tablet (10 mg total) by mouth every 6 (six) hours., Starting Thu 08/25/2017, Print    pantoprazole (PROTONIX) 20 MG tablet Take 1 tablet (20 mg total) by mouth daily., Starting Thu 08/25/2017, Print         Derwood Kaplan, MD 08/28/17 [phone]

## 2017-09-01 ENCOUNTER — Other Ambulatory Visit: Payer: Self-pay | Admitting: Gastroenterology

## 2017-09-01 DIAGNOSIS — R112 Nausea with vomiting, unspecified: Secondary | ICD-10-CM

## 2017-09-05 ENCOUNTER — Ambulatory Visit
Admission: RE | Admit: 2017-09-05 | Discharge: 2017-09-05 | Disposition: A | Discharge status: Home / Self Care | Payer: Managed Care, Other (non HMO) | Source: Ambulatory Visit | Attending: Gastroenterology | Admitting: Gastroenterology

## 2017-09-05 DIAGNOSIS — R112 Nausea with vomiting, unspecified: Secondary | ICD-10-CM

## 2017-09-05 MED ORDER — IOPAMIDOL (ISOVUE-300) INJECTION 61%
75.0000 mL | Freq: Once | INTRAVENOUS | Status: AC | PRN
  Administered 2017-09-05: 75 mL via INTRAVENOUS

## 2018-04-26 IMAGING — CR DG ABDOMEN ACUTE W/ 1V CHEST
3 series · 3 of 3 positions shown · non-contrast
Comparison: Chest x-ray dated 04/11/2017

CLINICAL DATA: Abdominal pain.  Vomiting.  Left-sided chest pain.

EXAM:
DG ABDOMEN ACUTE W/ 1V CHEST

[chest pa]
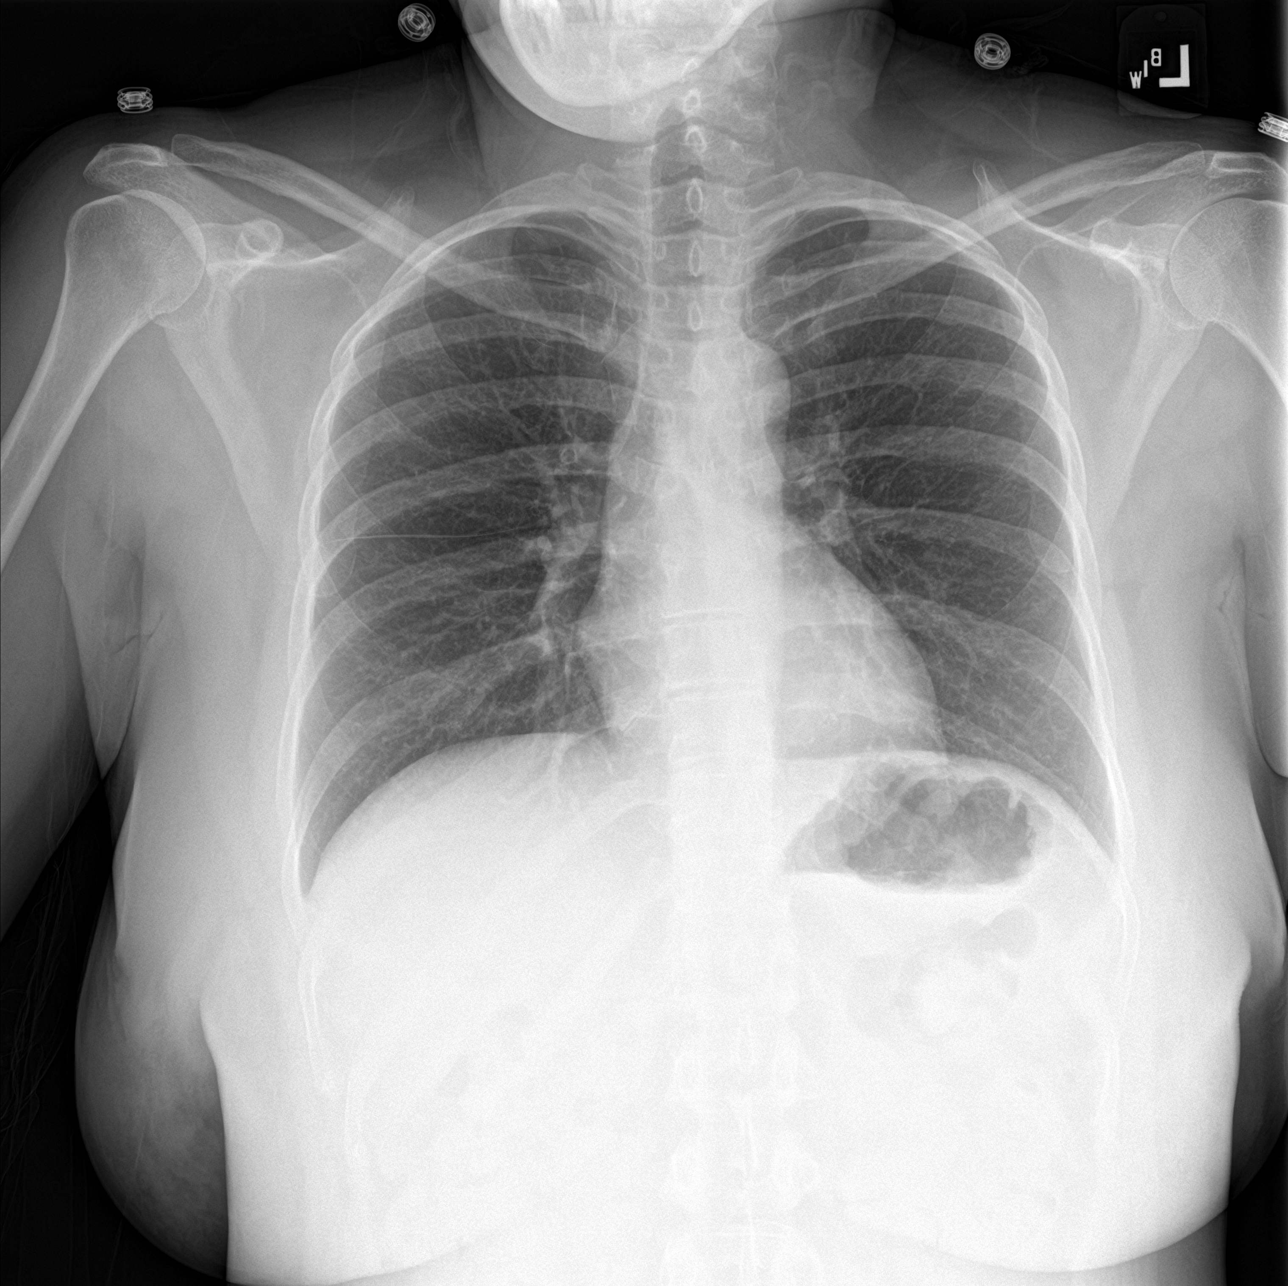

[abdomen erect]
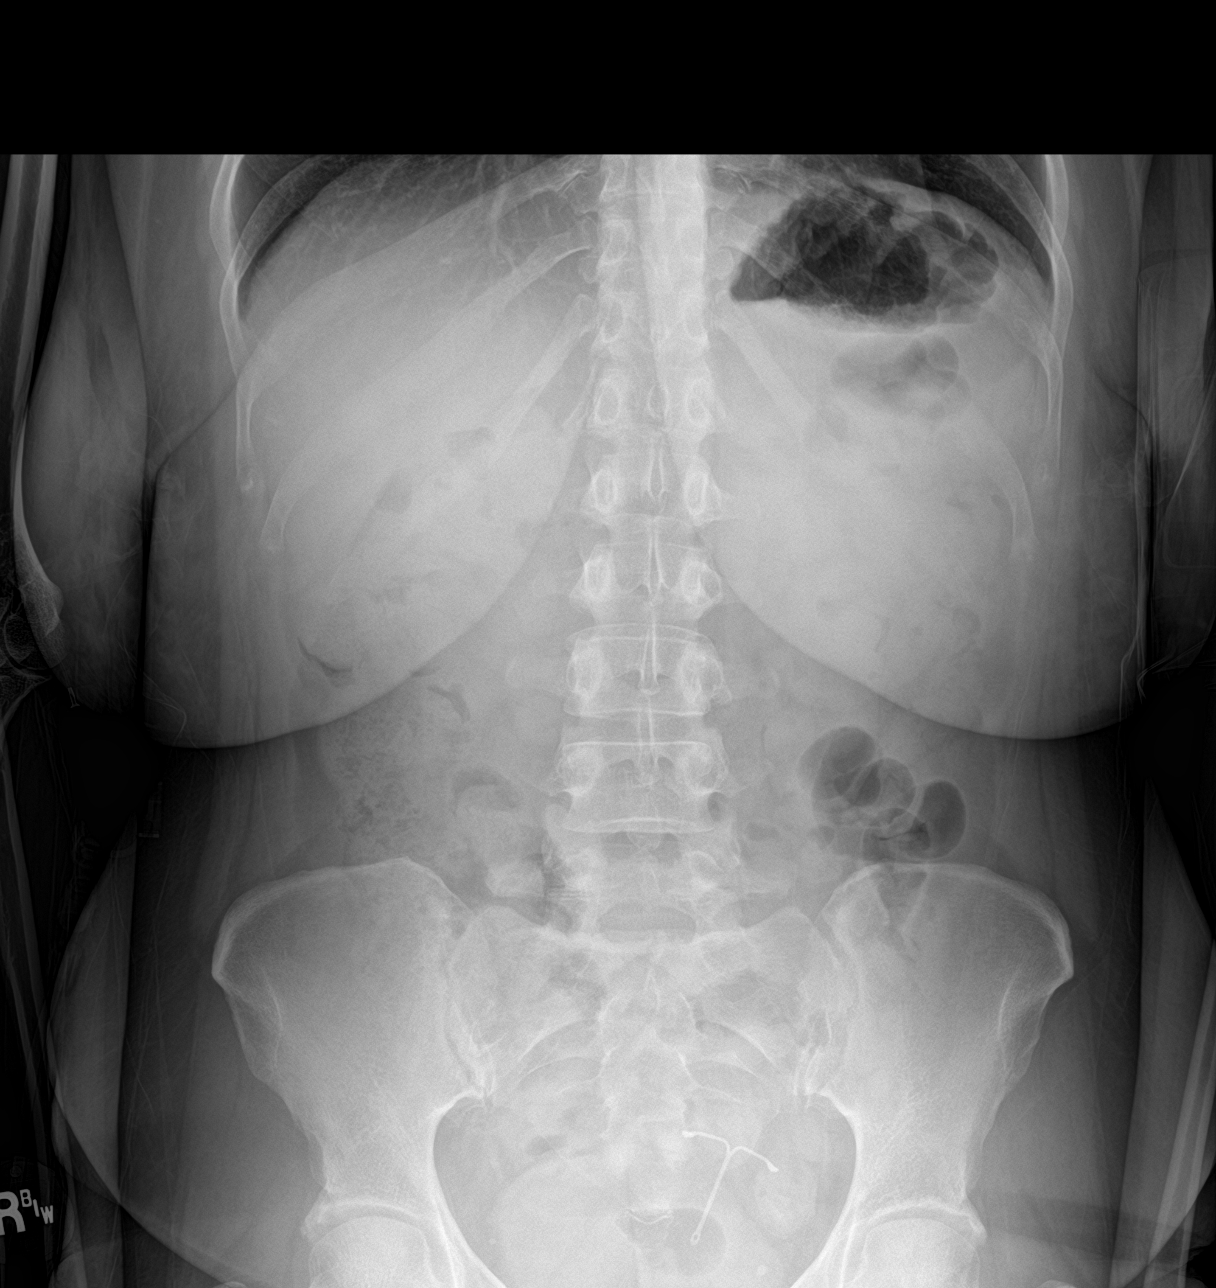

[abdomen supine]
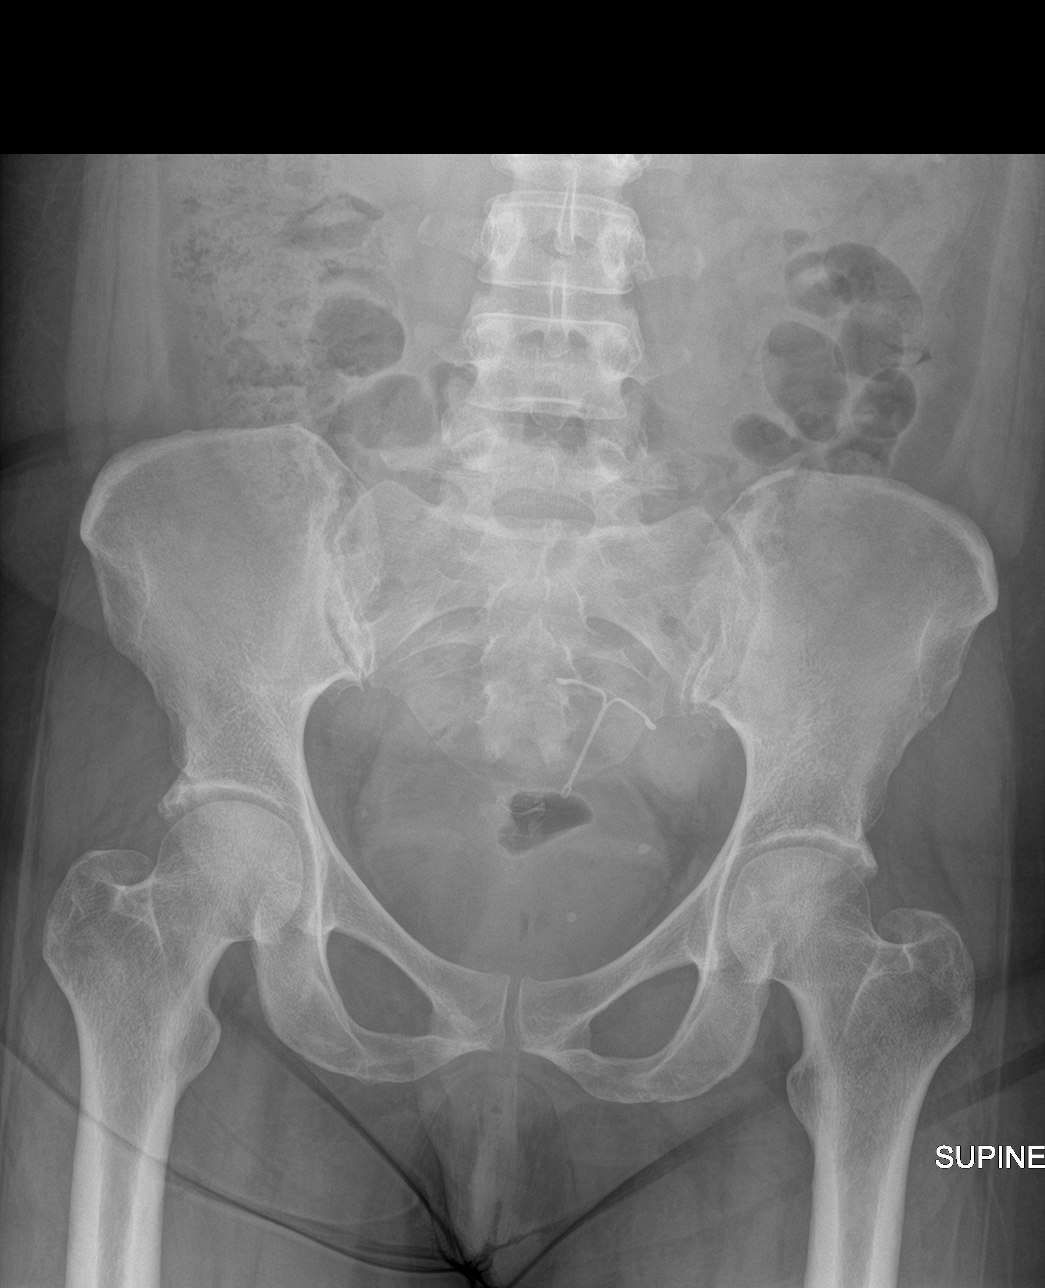

[3 of 3 positions shown; findings below may reference images not displayed]

FINDINGS: There is no evidence of dilated bowel loops or free intraperitoneal
air. No radiopaque calculi or other significant radiographic
abnormality is seen. IUD in place. Heart size and mediastinal
contours are within normal limits. Both lungs are clear.
IMPRESSION: Negative abdominal radiographs.  No acute cardiopulmonary disease.

## 2018-04-26 IMAGING — CT CT ABD-PELV W/ CM
2 of 5 series · 17 of 46 positions shown, 19 images · IV contrast (APPLIED)
Comparison: None.

CLINICAL DATA: Constipation. Abdominal distention with nausea and
vomiting for 7 days.

EXAM:
CT ABDOMEN AND PELVIS WITH CONTRAST
TECHNIQUE: Multidetector CT imaging of the abdomen and pelvis was performed
using the standard protocol following bolus administration of
intravenous contrast.
CONTRAST:  100mL OZ97FK-PTT IOPAMIDOL (OZ97FK-PTT) INJECTION 61%

[Series 3: abdomen 5.0 · axial · 0.72mm/px · z∈[+895,+1285]mm · 14 of 90 slices shown, 16 images]
[im 6/90  soft-tissue]
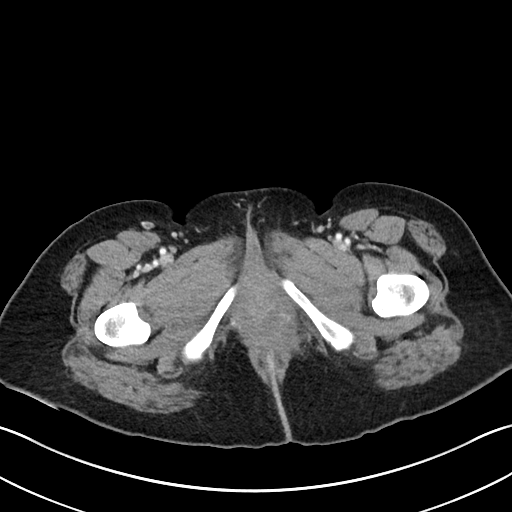
[im 6/90  bone]
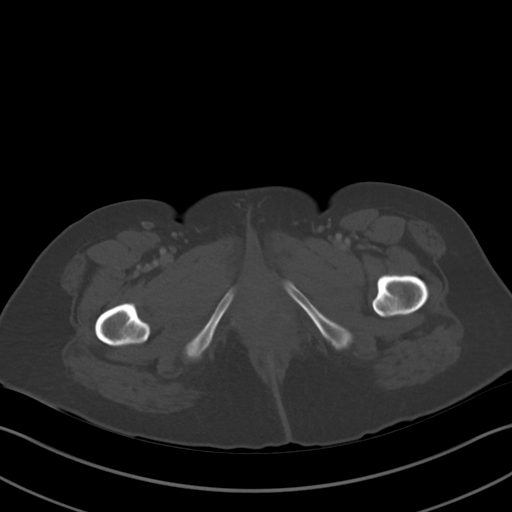
[im 11/90  soft-tissue]
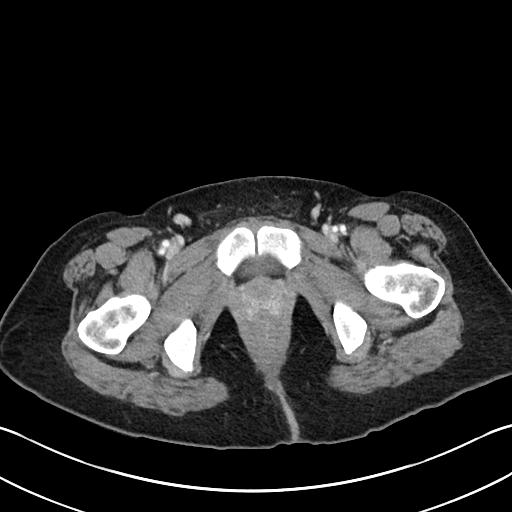
[im 16/90  soft-tissue]
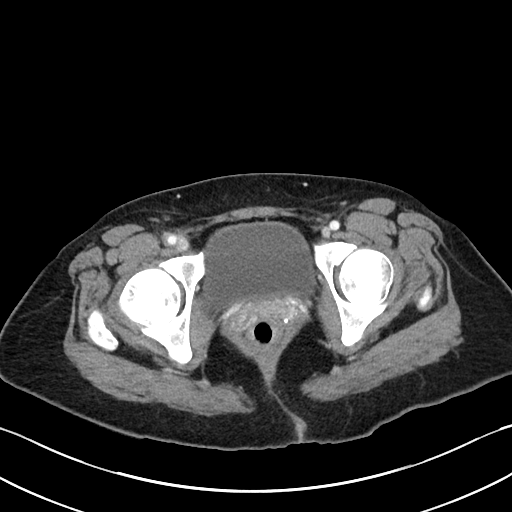
[im 27/90  soft-tissue]
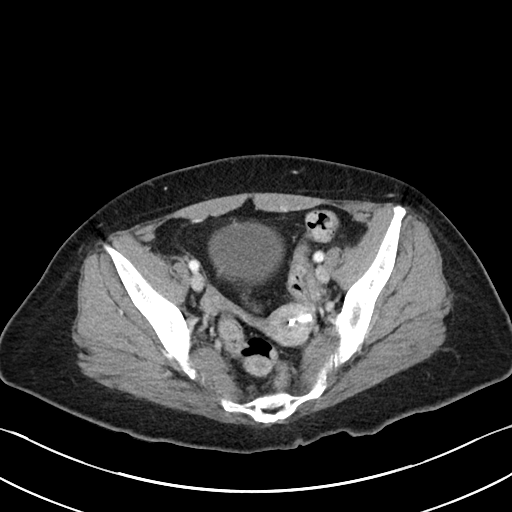
[im 32/90  soft-tissue]
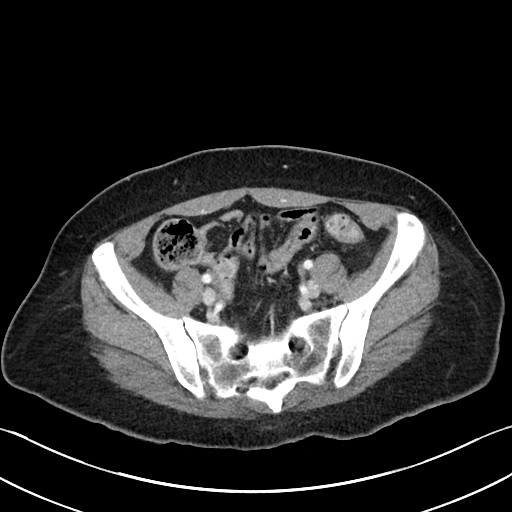
[im 37/90  soft-tissue]
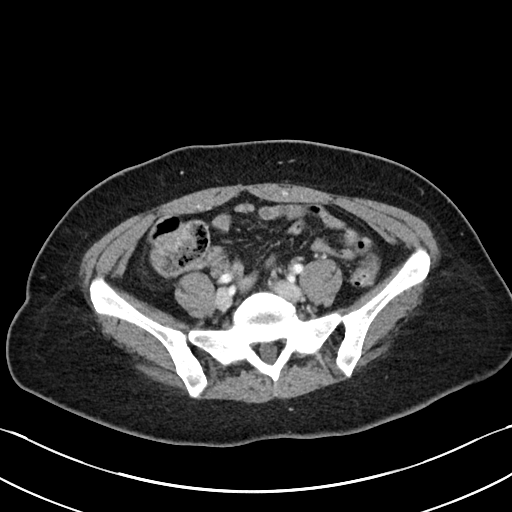
[im 42/90  soft-tissue]
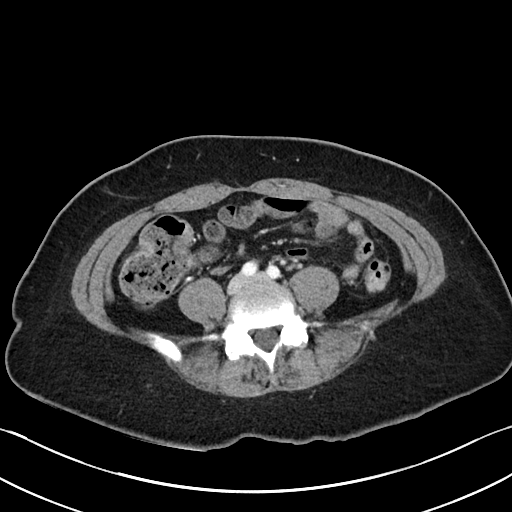
[im 48/90  soft-tissue]
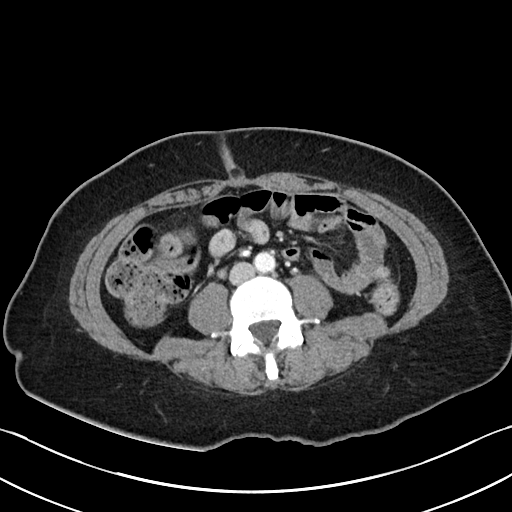
[im 53/90  soft-tissue]
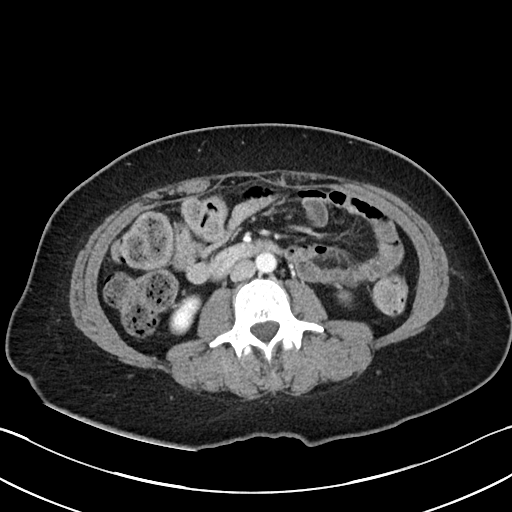
[im 53/90  bone]
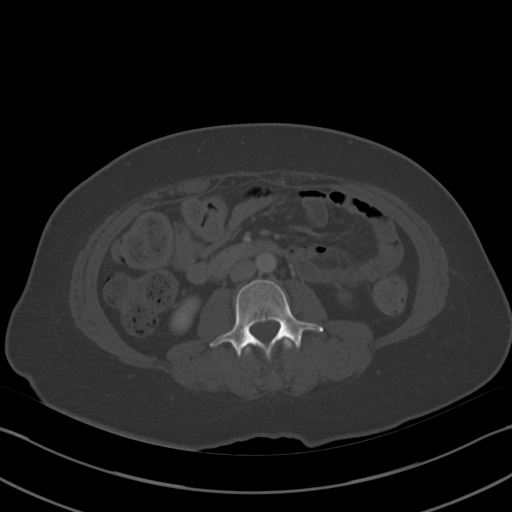
[im 58/90  soft-tissue]
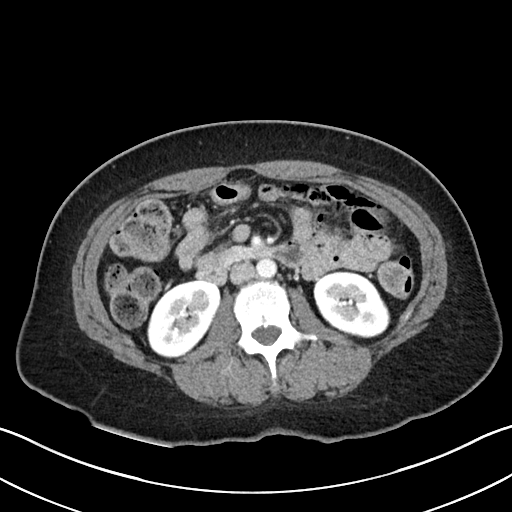
[im 69/90  soft-tissue]
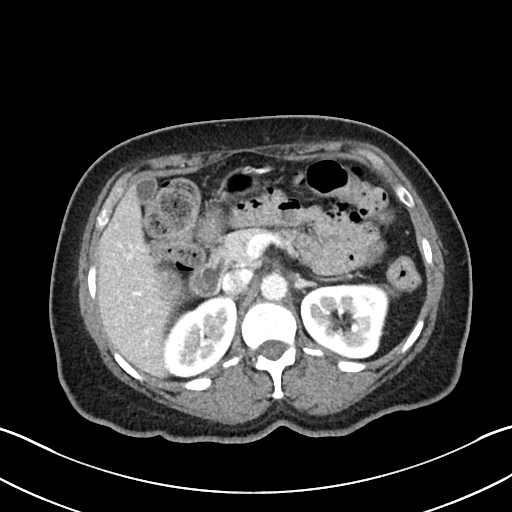
[im 74/90  soft-tissue]
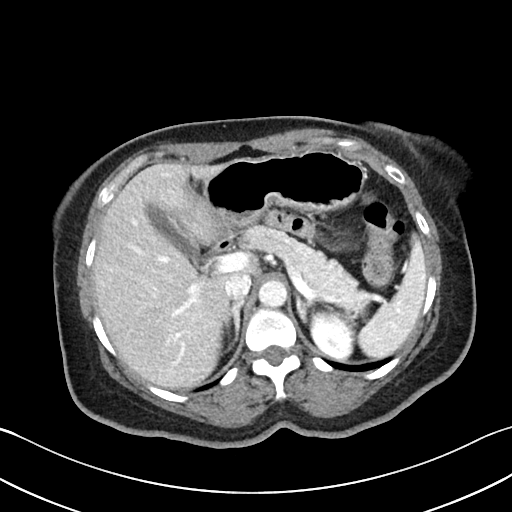
[im 79/90  soft-tissue]
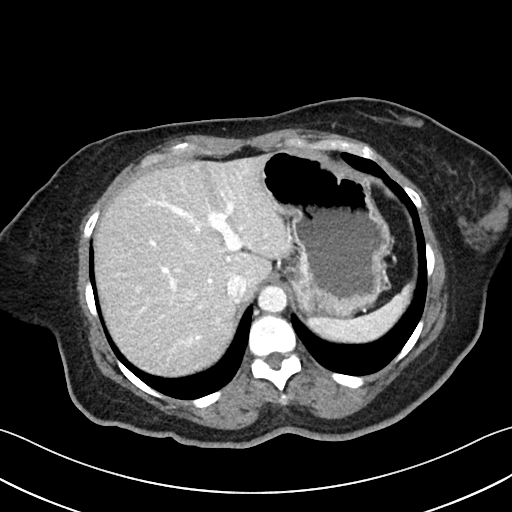
[im 84/90  soft-tissue]
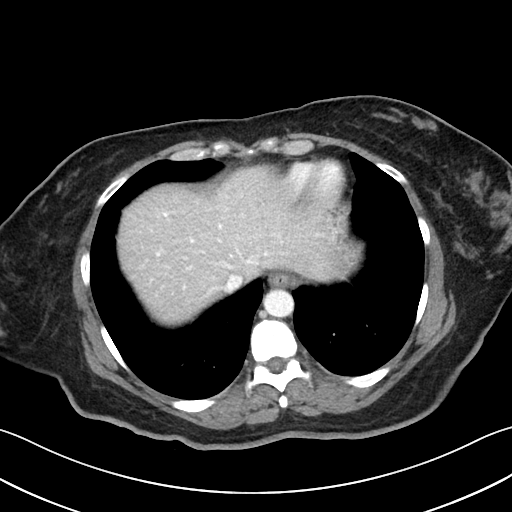

[Series 6: abdomen 3.0 mpr cor · coronal · 0.86mm/px · 3 of 86 slices shown]
[im 29/86  soft-tissue]
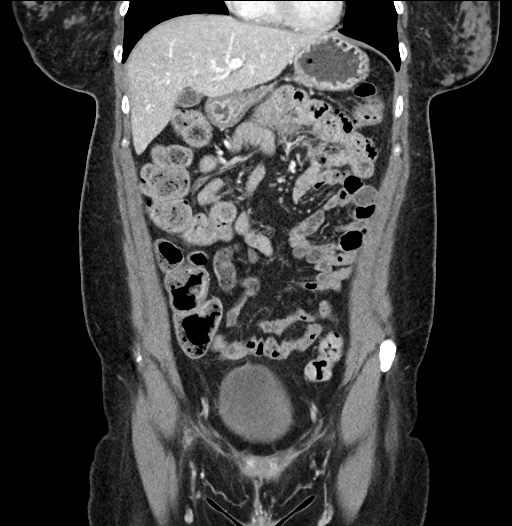
[im 38/86  soft-tissue]
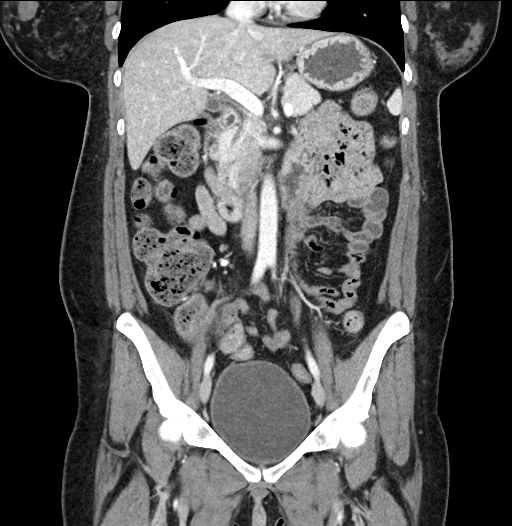
[im 48/86  soft-tissue]
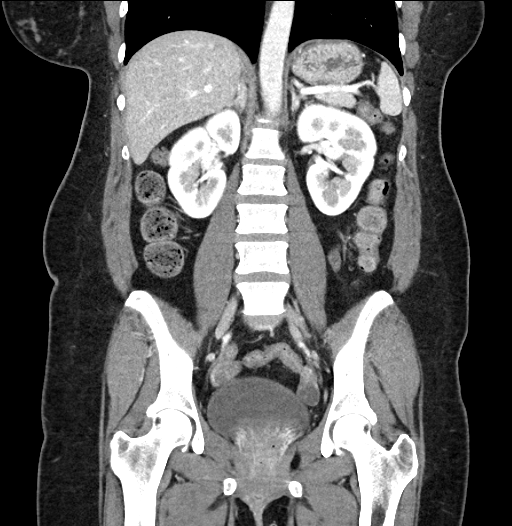

[17 of 46 positions shown; findings below may reference images not displayed]

FINDINGS: Lower chest: No acute abnormality.

Hepatobiliary: No focal liver abnormality is seen. No gallstones,
gallbladder wall thickening, or biliary dilatation.

Pancreas: Unremarkable. No pancreatic ductal dilatation or
surrounding inflammatory changes.

Spleen: Normal in size without focal abnormality.

Adrenals/Urinary Tract: Adrenal glands are unremarkable. Kidneys are
normal, without renal calculi, focal lesion, or hydronephrosis.
Bladder is unremarkable.Adrenal glands are normal.

Stomach/Bowel: The stomach and small bowel are normal. A few
scattered colonic diverticuli are seen without diverticulitis. There
is fecal loading in the right colon. The colon is otherwise normal.
The appendix is not well seen but there is no secondary evidence of
appendicitis.

Vascular/Lymphatic: No significant vascular findings are present. No
enlarged abdominal or pelvic lymph nodes.

Reproductive: An IUD is identified. A small follicle is seen in the
left ovary. The right ovary normal.

Other: No free air or free fluid. A small fat containing umbilical
hernia is identified.

Musculoskeletal: No acute or significant osseous findings.
IMPRESSION: 1. Fecal loading is seen in the right colon. No other acute
abnormalities.
